# Patient Record
Sex: Female | Born: 2000 | Race: Black or African American | Hispanic: No | Marital: Single | Smoking: Never smoker
Health system: Southern US, Community
[De-identification: ages and names within clinical notes are randomized; demographics above are authoritative.]

## PROBLEM LIST (undated history)

## (undated) DIAGNOSIS — N946 Dysmenorrhea, unspecified: Secondary | ICD-10-CM

## (undated) DIAGNOSIS — J45909 Unspecified asthma, uncomplicated: Secondary | ICD-10-CM

## (undated) DIAGNOSIS — Z23 Encounter for immunization: Secondary | ICD-10-CM

## (undated) DIAGNOSIS — Z842 Family history of other diseases of the genitourinary system: Secondary | ICD-10-CM

## (undated) HISTORY — DX: Encounter for immunization: Z23

## (undated) HISTORY — DX: Dysmenorrhea, unspecified: N94.6

## (undated) HISTORY — DX: Unspecified asthma, uncomplicated: J45.909

## (undated) HISTORY — DX: Family history of other diseases of the genitourinary system: Z84.2

## (undated) HISTORY — PX: NO PAST SURGERIES: SHX2092

---
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
## 2019-04-13 ENCOUNTER — Ambulatory Visit: Payer: Medicaid Other | Attending: Internal Medicine

## 2019-04-13 DIAGNOSIS — Z23 Encounter for immunization: Secondary | ICD-10-CM

## 2019-04-13 NOTE — Progress Notes (Signed)
   Covid-19 Vaccination Clinic  Name:  Erica Leonard    MRN: [id number] DOB: 12-28-2000  04/13/2019  [name] was observed post Covid-19 immunization for 15 minutes without incident. She was provided with Vaccine Information Sheet and instruction to access the V-Safe system.   [name] was instructed to call 911 with any severe reactions post vaccine: Marland Kitchen Difficulty breathing  . Swelling of face and throat  . A fast heartbeat  . A bad rash all over body  . Dizziness and weakness   Immunizations Administered    Name Date Dose VIS Date Route   Pfizer COVID-19 Vaccine 04/13/2019  4:15 PM 0.3 mL 12/29/2018 Intramuscular   Manufacturer: [employer], [employer]   Lot: [id number]   NDC: [address]-7

## 2019-05-08 ENCOUNTER — Ambulatory Visit: Payer: Medicaid Other | Attending: Internal Medicine

## 2019-05-08 DIAGNOSIS — Z23 Encounter for immunization: Secondary | ICD-10-CM

## 2019-05-08 NOTE — Progress Notes (Signed)
   Covid-19 Vaccination Clinic  Name:  TARAH BUBOLTZ    MRN: [id number] DOB: May 02, 2000  05/08/2019  Ms. Pellum was observed post Covid-19 immunization for 15 minutes without incident. She was provided with Vaccine Information Sheet and instruction to access the V-Safe system.   Ms. Tocci was instructed to call 911 with any severe reactions post vaccine: Marland Kitchen Difficulty breathing  . Swelling of face and throat  . A fast heartbeat  . A bad rash all over body  . Dizziness and weakness   Immunizations Administered    Name Date Dose VIS Date Route   Pfizer COVID-19 Vaccine 05/08/2019  1:46 PM 0.3 mL 03/14/2018 Intramuscular   Manufacturer: [employer], [employer]   Lot: [id number]   NDC: [address]-1

## 2020-07-01 DIAGNOSIS — Z113 Encounter for screening for infections with a predominantly sexual mode of transmission: Secondary | ICD-10-CM | POA: Diagnosis not present

## 2020-08-25 ENCOUNTER — Other Ambulatory Visit: Payer: Self-pay

## 2020-08-25 ENCOUNTER — Emergency Department (HOSPITAL_COMMUNITY)
Admission: EM | Admit: 2020-08-25 | Discharge: 2020-08-26 | Disposition: A | Discharge status: Home / Self Care | Payer: Medicaid Other | Attending: Emergency Medicine | Admitting: Emergency Medicine

## 2020-08-25 ENCOUNTER — Encounter (HOSPITAL_COMMUNITY): Payer: Self-pay | Admitting: Emergency Medicine

## 2020-08-25 DIAGNOSIS — S3992XA Unspecified injury of lower back, initial encounter: Secondary | ICD-10-CM | POA: Diagnosis present

## 2020-08-25 DIAGNOSIS — S39012A Strain of muscle, fascia and tendon of lower back, initial encounter: Secondary | ICD-10-CM

## 2020-08-25 DIAGNOSIS — J45909 Unspecified asthma, uncomplicated: Secondary | ICD-10-CM | POA: Diagnosis not present

## 2020-08-25 DIAGNOSIS — X58XXXA Exposure to other specified factors, initial encounter: Secondary | ICD-10-CM | POA: Insufficient documentation

## 2020-08-25 NOTE — ED Triage Notes (Signed)
Pt states she was recently treated for std and wants to know if it has resolved. Pt also c/o lower back pain for a couple of days.

## 2020-08-26 LAB — URINALYSIS, ROUTINE W REFLEX MICROSCOPIC
Bilirubin Urine: NEGATIVE
Glucose, UA: NEGATIVE mg/dL
Hgb urine dipstick: NEGATIVE
Ketones, ur: NEGATIVE mg/dL
Leukocytes,Ua: NEGATIVE
Nitrite: NEGATIVE
Protein, ur: NEGATIVE mg/dL
Specific Gravity, Urine: 1.025 (ref 1.005–1.030)
pH: 6 (ref 5.0–8.0)

## 2020-08-26 LAB — POCT PREGNANCY, URINE: Preg Test, Ur: NEGATIVE

## 2020-08-26 MED ORDER — IBUPROFEN 800 MG PO TABS: 800.0000 mg | ORAL_TABLET | Freq: Four times a day (QID) | ORAL | 0 refills | Status: AC | PRN

## 2020-08-26 NOTE — ED Notes (Signed)
Escorted to bathroom to collect a urine specimen.

## 2020-08-26 NOTE — ED Provider Notes (Signed)
[facility] EMERGENCY DEPARTMENT Provider Note   CSN: [id number] Arrival date & time: 08/25/20  2328     History Chief Complaint  Patient presents with   SEXUALLY TRANSMITTED DISEASE    Erica Leonard is a 20 y.o. female.  Patient reports left lower back pain that started in the last couple of days.  She reports that she thinks she pulled a muscle because it hurts when she bends and twists.  Pain does not radiate.  No dysuria, hematuria or urinary frequency.  Patient reports that she was recently treated for STD and wants to make sure the back pain is not from the STD.  She does not have any external genital sores.  No vaginal discharge or pelvic pain.      Past Medical History:  Diagnosis Date   Asthma    Dysmenorrhea in adolescent    Family history of endometriosis    Immunization, viral disease    gardasil completed    There are no problems to display for this patient.   Past Surgical History:  Procedure Laterality Date   NO PAST SURGERIES       OB History     Gravida  0   Para  0   Term  0   Preterm  0   AB  0   Living  0      SAB  0   IAB  0   Ectopic  0   Multiple  0   Live Births  0           Family History  Problem Relation Age of Onset   Down syndrome Brother    Hypertension Maternal Grandmother    Cancer Paternal Grandfather        bladder   Hypertension Paternal Grandfather     Social History   Tobacco Use   Smoking status: Never   Smokeless tobacco: Never  Vaping Use   Vaping Use: Never used  Substance Use Topics   Alcohol use: No   Drug use: No    Home Medications Prior to Admission medications   Medication Sig Start Date End Date Taking? Authorizing Provider  ibuprofen (ADVIL) 800 MG tablet Take 1 tablet (800 mg total) by mouth every 6 (six) hours as needed for moderate pain. 08/26/20  Yes Champagne Paletta, Canary Brim, MD  albuterol (PROAIR HFA) 108 (90 Base) MCG/ACT inhaler USE 2 PUFFS EVERY 4 HOURS AS NEEDED 03/03/16    [provider]  norethindrone-ethinyl estradiol (JUNEL FE 1/20) 1-20 MG-MCG tablet Take 1 tablet by mouth daily. 09/18/18   Copland, Ilona Sorrel, PA-C    Allergies    Patient has no known allergies.  Review of Systems   Review of Systems  Genitourinary: Negative.   Musculoskeletal:  Positive for back pain.  All other systems reviewed and are negative.  Physical Exam Updated Vital Signs BP (!) 134/104   Pulse 88   Temp 98.3 F (36.8 C) (Oral)   Resp 18   Ht 5\' 5"  (1.651 m)   Wt 62 kg   LMP 08/17/2020   SpO2 99%   BMI 22.75 kg/m   Physical Exam Vitals and nursing note reviewed.  Constitutional:      General: She is not in acute distress.    Appearance: Normal appearance. She is well-developed.  HENT:     Head: Normocephalic and atraumatic.     Right Ear: Hearing normal.     Left Ear: Hearing normal.  Nose: Nose normal.  Eyes:     Conjunctiva/sclera: Conjunctivae normal.     Pupils: Pupils are equal, round, and reactive to light.  Cardiovascular:     Rate and Rhythm: Regular rhythm.     Heart sounds: S1 normal and S2 normal. No murmur heard.   No friction rub. No gallop.  Pulmonary:     Effort: Pulmonary effort is normal. No respiratory distress.     Breath sounds: Normal breath sounds.  Chest:     Chest wall: No tenderness.  Abdominal:     General: Bowel sounds are normal.     Palpations: Abdomen is soft.     Tenderness: There is no abdominal tenderness. There is no guarding or rebound. Negative signs include Murphy's sign and McBurney's sign.     Hernia: No hernia is present.  Musculoskeletal:     Cervical back: Normal range of motion and neck supple.     Thoracic back: Normal.     Lumbar back: Spasms and tenderness present. No bony tenderness. Negative right straight leg raise test and negative left straight leg raise test.       Back:  Skin:    General: Skin is warm and dry.     Findings: No rash.  Neurological:     Mental Status: She is  alert and oriented to person, place, and time.     GCS: GCS eye subscore is 4. GCS verbal subscore is 5. GCS motor subscore is 6.     Cranial Nerves: No cranial nerve deficit.     Sensory: No sensory deficit.     Coordination: Coordination normal.  Psychiatric:        Speech: Speech normal.        Behavior: Behavior normal.        Thought Content: Thought content normal.    ED Results / Procedures / Treatments   Labs (all labs ordered are listed, but only abnormal results are displayed) Labs Reviewed  URINALYSIS, ROUTINE W REFLEX MICROSCOPIC  GC/CHLAMYDIA PROBE AMP ([place]) NOT AT [facility]    EKG None  Radiology No results found.  Procedures Procedures   Medications Ordered in ED Medications - No data to display  ED Course  I have reviewed the triage vital signs and the nursing notes.  Pertinent labs & imaging results that were available during my care of the patient were reviewed by me and considered in my medical decision making (see chart for details).    MDM Rules/Calculators/A&P                           Patient with left low back pain.  Pain is reproducible with movements of the torso.  No red flags, pain does not radiate.  No foot drop or saddle anesthesia.  Normal strength and sensation in both lower extremities.  Urinalysis without signs of infection.  GC chlamydia pending, doubt this is the cause of her pain. Final Clinical Impression(s) / ED Diagnoses Final diagnoses:  Lumbar strain, initial encounter    Rx / DC Orders ED Discharge Orders          Ordered    ibuprofen (ADVIL) 800 MG tablet  Every 6 hours PRN        08/26/20 0112             Gilda Crease, MD 08/26/20 [phone]

## 2020-08-27 LAB — GC/CHLAMYDIA PROBE AMP (~~LOC~~) NOT AT ARMC
Chlamydia: NEGATIVE
Comment: NEGATIVE
Comment: NORMAL
Neisseria Gonorrhea: NEGATIVE

## 2021-01-05 ENCOUNTER — Telehealth: Payer: Self-pay

## 2021-01-05 ENCOUNTER — Other Ambulatory Visit: Payer: Self-pay | Admitting: Obstetrics and Gynecology

## 2021-01-05 DIAGNOSIS — Z3041 Encounter for surveillance of contraceptive pills: Secondary | ICD-10-CM

## 2021-01-05 DIAGNOSIS — N946 Dysmenorrhea, unspecified: Secondary | ICD-10-CM

## 2021-01-05 MED ORDER — NORETHIN ACE-ETH ESTRAD-FE 1-20 MG-MCG PO TABS: 1.0000 | ORAL_TABLET | Freq: Every day | ORAL | 0 refills | Status: DC

## 2021-01-05 NOTE — Telephone Encounter (Signed)
Patient is scheduled for 01/22/21 with ABC

## 2021-01-05 NOTE — Progress Notes (Signed)
[facility] Rx RF till 1/23 annual

## 2021-01-05 NOTE — Telephone Encounter (Signed)
Rx eRxd.  

## 2021-01-05 NOTE — Telephone Encounter (Signed)
Pt calling for refill of bc to be sent to [facility] on [address]  [phone]

## 2021-01-22 ENCOUNTER — Other Ambulatory Visit: Payer: Self-pay

## 2021-01-22 ENCOUNTER — Ambulatory Visit: Payer: Medicaid Other | Admitting: Obstetrics and Gynecology

## 2021-02-02 DIAGNOSIS — J00 Acute nasopharyngitis [common cold]: Secondary | ICD-10-CM | POA: Diagnosis not present

## 2021-02-02 DIAGNOSIS — J028 Acute pharyngitis due to other specified organisms: Secondary | ICD-10-CM | POA: Diagnosis not present

## 2021-02-02 DIAGNOSIS — Z03818 Encounter for observation for suspected exposure to other biological agents ruled out: Secondary | ICD-10-CM | POA: Diagnosis not present

## 2021-03-10 DIAGNOSIS — Z3041 Encounter for surveillance of contraceptive pills: Secondary | ICD-10-CM | POA: Diagnosis not present

## 2021-03-10 DIAGNOSIS — Z1331 Encounter for screening for depression: Secondary | ICD-10-CM | POA: Diagnosis not present

## 2021-03-10 DIAGNOSIS — Z0000 Encounter for general adult medical examination without abnormal findings: Secondary | ICD-10-CM | POA: Diagnosis not present

## 2021-03-10 DIAGNOSIS — F411 Generalized anxiety disorder: Secondary | ICD-10-CM | POA: Diagnosis not present

## 2021-03-10 DIAGNOSIS — E538 Deficiency of other specified B group vitamins: Secondary | ICD-10-CM | POA: Insufficient documentation

## 2021-03-10 DIAGNOSIS — J4520 Mild intermittent asthma, uncomplicated: Secondary | ICD-10-CM | POA: Diagnosis not present

## 2022-02-19 DIAGNOSIS — J019 Acute sinusitis, unspecified: Secondary | ICD-10-CM | POA: Diagnosis not present

## 2022-02-19 DIAGNOSIS — H66003 Acute suppurative otitis media without spontaneous rupture of ear drum, bilateral: Secondary | ICD-10-CM | POA: Diagnosis not present

## 2022-02-19 DIAGNOSIS — B9689 Other specified bacterial agents as the cause of diseases classified elsewhere: Secondary | ICD-10-CM | POA: Diagnosis not present

## 2022-02-19 DIAGNOSIS — Z03818 Encounter for observation for suspected exposure to other biological agents ruled out: Secondary | ICD-10-CM | POA: Diagnosis not present

## 2022-04-15 DIAGNOSIS — R110 Nausea: Secondary | ICD-10-CM | POA: Diagnosis not present

## 2022-04-15 DIAGNOSIS — J301 Allergic rhinitis due to pollen: Secondary | ICD-10-CM | POA: Diagnosis not present

## 2022-06-17 DIAGNOSIS — J4520 Mild intermittent asthma, uncomplicated: Secondary | ICD-10-CM | POA: Insufficient documentation

## 2022-06-17 DIAGNOSIS — Z124 Encounter for screening for malignant neoplasm of cervix: Secondary | ICD-10-CM | POA: Diagnosis not present

## 2022-11-08 DIAGNOSIS — R079 Chest pain, unspecified: Secondary | ICD-10-CM | POA: Diagnosis not present

## 2022-11-12 DIAGNOSIS — J019 Acute sinusitis, unspecified: Secondary | ICD-10-CM | POA: Diagnosis not present

## 2023-01-03 ENCOUNTER — Other Ambulatory Visit: Payer: Self-pay

## 2023-01-03 ENCOUNTER — Encounter: Payer: Self-pay | Admitting: Emergency Medicine

## 2023-01-03 ENCOUNTER — Ambulatory Visit
Admission: EM | Admit: 2023-01-03 | Discharge: 2023-01-03 | Disposition: A | Discharge status: Home / Self Care | Payer: Medicaid Other

## 2023-01-03 DIAGNOSIS — H8110 Benign paroxysmal vertigo, unspecified ear: Secondary | ICD-10-CM | POA: Diagnosis not present

## 2023-01-03 DIAGNOSIS — J0100 Acute maxillary sinusitis, unspecified: Secondary | ICD-10-CM

## 2023-01-03 LAB — POCT URINE PREGNANCY: Preg Test, Ur: NEGATIVE

## 2023-01-03 MED ORDER — AMOXICILLIN-POT CLAVULANATE 875-125 MG PO TABS: 1.0000 | ORAL_TABLET | Freq: Two times a day (BID) | ORAL | 0 refills | Status: AC

## 2023-01-03 MED ORDER — MECLIZINE HCL 25 MG PO TABS: 25.0000 mg | ORAL_TABLET | Freq: Three times a day (TID) | ORAL | 0 refills | Status: AC | PRN

## 2023-01-03 NOTE — ED Triage Notes (Signed)
Pt reports emesis and dizziness episodes x1 day. Pt reports chronic, recurrent hx of dizziness due to sinus issues since she was 22 years old. Dizziness episodes aggravated by bending over and quick blinking of eyes. 2 emesis episodes today.

## 2023-01-13 ENCOUNTER — Encounter: Payer: Self-pay | Admitting: Physician Assistant

## 2023-01-13 NOTE — ED Provider Notes (Signed)
[facility]    CSN: [id number] Arrival date & time: 01/03/23  1711      History   Chief Complaint Chief Complaint  Patient presents with   Emesis   Dizziness    HPI Erica Leonard is a 22 y.o. female.   Patient here today for evaluation of vomiting and dizziness that started today.  She reports that she does have history of recurrent dizziness since she was a young child.  She reports that dizziness is aggravated by bending over and quick blinking eyes.  She has vomited twice today.  She denies any significant headache.  She has not had any other symptoms.  The history is provided by the patient.  Emesis Associated symptoms: no abdominal pain, no chills, no fever and no headaches   Dizziness Associated symptoms: nausea and vomiting   Associated symptoms: no headaches and no shortness of breath     Past Medical History:  Diagnosis Date   Asthma    Dysmenorrhea in adolescent    Family history of endometriosis    Immunization, viral disease    gardasil completed    Patient Active Problem List   Diagnosis Date Noted   Mild intermittent asthma without complication 06/17/2022   Vitamin B12 deficiency 03/10/2021   Generalized anxiety disorder 03/10/2021   Dysmenorrhea 03/05/2015    Past Surgical History:  Procedure Laterality Date   NO PAST SURGERIES      OB History     Gravida  0   Para  0   Term  0   Preterm  0   AB  0   Living  0      SAB  0   IAB  0   Ectopic  0   Multiple  0   Live Births  0            Home Medications    Prior to Admission medications   Medication Sig Start Date End Date Taking? Authorizing Provider  albuterol (VENTOLIN HFA) 108 (90 Base) MCG/ACT inhaler Inhale 2 puffs into the lungs every 4 (four) hours as needed for wheezing or shortness of breath. Needs refill 03/03/16  Yes [provider]  amoxicillin-clavulanate (AUGMENTIN) 875-125 MG tablet Take 1 tablet by mouth every 12 (twelve)  hours. 01/03/23  Yes Tomi Bamberger, PA-C  FLUoxetine (PROZAC) 10 MG capsule Take 10 mg by mouth daily. 06/30/20  Yes [provider]  FLUoxetine (PROZAC) 20 MG capsule Take 20 mg by mouth daily. 06/17/22  Yes [provider]  fluticasone (FLONASE) 50 MCG/ACT nasal spray Place 1 spray into both nostrils 2 (two) times daily.   Yes [provider]  meclizine (ANTIVERT) 25 MG tablet Take 1 tablet (25 mg total) by mouth 3 (three) times daily as needed for dizziness. 01/03/23  Yes Tomi Bamberger, PA-C  albuterol (PROAIR HFA) 108 (90 Base) MCG/ACT inhaler USE 2 PUFFS EVERY 4 HOURS AS NEEDED 03/03/16   [provider]  ibuprofen (ADVIL) 800 MG tablet Take 1 tablet (800 mg total) by mouth every 6 (six) hours as needed for moderate pain. 08/26/20   Gilda Crease, MD  propranolol (INDERAL) 20 MG tablet Take 20 mg by mouth 2 (two) times daily. Patient not taking: Reported on 01/03/2023 12/05/19   [provider]    Family History Family History  Problem Relation Age of Onset   Endometriosis Mother    Hypertension Father    Down syndrome Brother    Hypertension  Maternal Grandmother    Cancer Paternal Grandfather        bladder   Hypertension Paternal Grandfather     Social History Social History   Tobacco Use   Smoking status: Never   Smokeless tobacco: Never  Vaping Use   Vaping status: Never Used  Substance Use Topics   Alcohol use: Yes    Comment: occasional   Drug use: No     Allergies   Patient has no known allergies.   Review of Systems Review of Systems  Constitutional:  Negative for chills and fever.  HENT:  Positive for congestion and sinus pressure.   Eyes:  Negative for discharge and redness.  Respiratory:  Negative for shortness of breath.   Gastrointestinal:  Positive for nausea and vomiting. Negative for abdominal pain.  Neurological:  Positive for dizziness. Negative for headaches.     Physical Exam Triage  Vital Signs ED Triage Vitals  Encounter Vitals Group     BP 01/03/23 1825 136/86     Systolic BP Percentile --      Diastolic BP Percentile --      Pulse Rate 01/03/23 1825 82     Resp 01/03/23 1825 16     Temp 01/03/23 1825 98.2 F (36.8 C)     Temp Source 01/03/23 1825 Oral     SpO2 01/03/23 1825 98 %     Weight --      Height --      Head Circumference --      Peak Flow --      Pain Score 01/03/23 1827 0     Pain Loc --      Pain Education --      Exclude from Growth Chart --    No data found.  Updated Vital Signs BP 136/86 (BP Location: Left Arm)   Pulse 82   Temp 98.2 F (36.8 C) (Oral)   Resp 16   LMP 12/03/2022 (Approximate)   SpO2 98%   Visual Acuity Right Eye Distance:   Left Eye Distance:   Bilateral Distance:    Right Eye Near:   Left Eye Near:    Bilateral Near:     Physical Exam Vitals and nursing note reviewed.  Constitutional:      General: She is not in acute distress.    Appearance: Normal appearance. She is not ill-appearing.  HENT:     Head: Normocephalic and atraumatic.     Nose: Congestion present. No rhinorrhea.  Eyes:     Extraocular Movements: Extraocular movements intact.     Conjunctiva/sclera: Conjunctivae normal.     Pupils: Pupils are equal, round, and reactive to light.  Cardiovascular:     Rate and Rhythm: Normal rate.  Pulmonary:     Effort: Pulmonary effort is normal.  Neurological:     General: No focal deficit present.     Mental Status: She is alert.  Psychiatric:        Mood and Affect: Mood normal.        Behavior: Behavior normal.        Thought Content: Thought content normal.      UC Treatments / Results  Labs (all labs ordered are listed, but only abnormal results are displayed) Labs Reviewed  POCT URINE PREGNANCY - Normal    EKG   Radiology No results found.  Procedures Procedures (including critical care time)  Medications Ordered in UC Medications - No data to display  Initial  Impression / Assessment  and Plan / UC Course  I have reviewed the triage vital signs and the nursing notes.  Pertinent labs & imaging results that were available during my care of the patient were reviewed by me and considered in my medical decision making (see chart for details).    Will treat to cover vertigo with meclizine as well as sinus infection with Augmentin.  Recommended follow-up if no gradual improvement or with any further concerns in ED with any worsening dizziness.  Patient expresses understanding.  Final Clinical Impressions(s) / UC Diagnoses   Final diagnoses:  Benign paroxysmal positional vertigo, unspecified laterality  Acute maxillary sinusitis, recurrence not specified   Discharge Instructions   None    ED Prescriptions     Medication Sig Dispense Auth. Provider   amoxicillin-clavulanate (AUGMENTIN) 875-125 MG tablet Take 1 tablet by mouth every 12 (twelve) hours. 14 tablet Erma Pinto F, PA-C   meclizine (ANTIVERT) 25 MG tablet Take 1 tablet (25 mg total) by mouth 3 (three) times daily as needed for dizziness. 30 tablet Tomi Bamberger, PA-C      PDMP not reviewed this encounter.   Tomi Bamberger, PA-C 01/13/23 [phone]

## 2023-07-11 DIAGNOSIS — F411 Generalized anxiety disorder: Secondary | ICD-10-CM | POA: Diagnosis not present

## 2023-07-11 DIAGNOSIS — R0789 Other chest pain: Secondary | ICD-10-CM | POA: Diagnosis not present

## 2023-07-28 DIAGNOSIS — Z1331 Encounter for screening for depression: Secondary | ICD-10-CM | POA: Diagnosis not present

## 2023-07-28 DIAGNOSIS — Z130 Encounter for screening for diseases of the blood and blood-forming organs and certain disorders involving the immune mechanism: Secondary | ICD-10-CM | POA: Diagnosis not present

## 2023-07-28 DIAGNOSIS — J4520 Mild intermittent asthma, uncomplicated: Secondary | ICD-10-CM | POA: Diagnosis not present

## 2023-07-28 DIAGNOSIS — F411 Generalized anxiety disorder: Secondary | ICD-10-CM | POA: Diagnosis not present

## 2023-07-28 DIAGNOSIS — Z131 Encounter for screening for diabetes mellitus: Secondary | ICD-10-CM | POA: Diagnosis not present

## 2023-07-28 DIAGNOSIS — Z114 Encounter for screening for human immunodeficiency virus [HIV]: Secondary | ICD-10-CM | POA: Diagnosis not present

## 2023-07-28 DIAGNOSIS — Z0000 Encounter for general adult medical examination without abnormal findings: Secondary | ICD-10-CM | POA: Diagnosis not present

## 2023-07-28 DIAGNOSIS — Z1329 Encounter for screening for other suspected endocrine disorder: Secondary | ICD-10-CM | POA: Diagnosis not present

## 2023-07-28 DIAGNOSIS — Z113 Encounter for screening for infections with a predominantly sexual mode of transmission: Secondary | ICD-10-CM | POA: Diagnosis not present

## 2023-07-28 DIAGNOSIS — Z13220 Encounter for screening for lipoid disorders: Secondary | ICD-10-CM | POA: Diagnosis not present

## 2023-07-28 DIAGNOSIS — Z0289 Encounter for other administrative examinations: Secondary | ICD-10-CM | POA: Diagnosis not present

## 2023-09-29 ENCOUNTER — Ambulatory Visit: Payer: Self-pay
